# Patient Record
Sex: Male | Born: 1935 | Race: White | Hispanic: No | Marital: Married
Health system: Southern US, Community
[De-identification: ages and names within clinical notes are randomized; demographics above are authoritative.]

---

## 2016-10-21 ENCOUNTER — Other Ambulatory Visit: Payer: Self-pay | Admitting: Orthopaedic Surgery

## 2016-10-21 DIAGNOSIS — M48062 Spinal stenosis, lumbar region with neurogenic claudication: Secondary | ICD-10-CM

## 2016-10-21 DIAGNOSIS — M542 Cervicalgia: Secondary | ICD-10-CM

## 2016-11-02 ENCOUNTER — Ambulatory Visit
Admission: RE | Admit: 2016-11-02 | Discharge: 2016-11-02 | Disposition: A | Discharge status: Home / Self Care | Payer: Medicare Other | Source: Ambulatory Visit | Attending: Orthopaedic Surgery | Admitting: Orthopaedic Surgery

## 2016-11-02 DIAGNOSIS — M48062 Spinal stenosis, lumbar region with neurogenic claudication: Secondary | ICD-10-CM

## 2016-11-02 DIAGNOSIS — M542 Cervicalgia: Secondary | ICD-10-CM

## 2016-11-02 MED ORDER — DIAZEPAM 5 MG PO TABS
5.0000 mg | ORAL_TABLET | Freq: Once | ORAL | Status: AC
  Administered 2016-11-02: 5 mg via ORAL

## 2016-11-02 MED ORDER — IOPAMIDOL (ISOVUE-M 300) INJECTION 61%
10.0000 mL | Freq: Once | INTRAMUSCULAR | Status: AC | PRN
  Administered 2016-11-02: 10 mL via INTRATHECAL

## 2016-11-02 MED ORDER — ONDANSETRON HCL 4 MG/2ML IJ SOLN: 4.0000 mg | Freq: Four times a day (QID) | INTRAMUSCULAR | Status: DC | PRN

## 2016-11-02 NOTE — Progress Notes (Signed)
Patient's wife says he has been off Brillinta for at least the past five days and Zoloft for at least two days.

## 2016-11-02 NOTE — Discharge Instructions (Addendum)
Myelogram Discharge Instructions  1. Go home and rest quietly for the next 24 hours.  It is important to lie flat for the next 24 hours.  Get up only to go to the restroom.  You may lie in the bed or on a couch on your back, your stomach, your left side or your right side.  You may have one pillow under your head.  You may have pillows between your knees while you are on your side or under your knees while you are on your back.  2. DO NOT drive today.  Recline the seat as far back as it will go, while still wearing your seat belt, on the way home.  3. You may get up to go to the bathroom as needed.  You may sit up for 10 minutes to eat.  You may resume your normal diet and medications unless otherwise indicated.  Drink lots of extra fluids today and tomorrow.  4. The incidence of headache, nausea, or vomiting is about 5% (one in 20 patients).  If you develop a headache, lie flat and drink plenty of fluids until the headache goes away.  Caffeinated beverages may be helpful.  If you develop severe nausea and vomiting or a headache that does not go away with flat bed rest, call [phone].  5. You may resume normal activities after your 24 hours of bed rest is over; however, do not exert yourself strongly or do any heavy lifting tomorrow. If when you get up you have a headache when standing, go back to bed and force fluids for another 24 hours.  6. Call your physician for a follow-up appointment.  The results of your myelogram will be sent directly to your physician by the following day.  7. If you have any questions or if complications develop after you arrive home, please call [phone].  Discharge instructions have been explained to the patient.  The patient, or the person responsible for the patient, fully understands these instructions.          May resume Zoloft on Oct. 30, 2018, after 10:30 am.

## 2018-05-08 IMAGING — XA DG MYELOGRAPHY LUMBAR INJ MULTI REGION
12 of 19 series · 12 of 19 positions shown · non-contrast
Comparison: none

CLINICAL DATA: Bilateral leg pain in weakness. Bilateral shoulder
and arm pain and arm weakness.
TECHNIQUE: Contiguous axial images were obtained through the Cervical and
Lumbar spine after the intrathecal infusion of infusion. Coronal and
sagittal reconstructions were obtained of the axial image sets.

[Series 1: vasc adipose · 1 of 1 slices shown (1 of 11)]
[im 1/1]
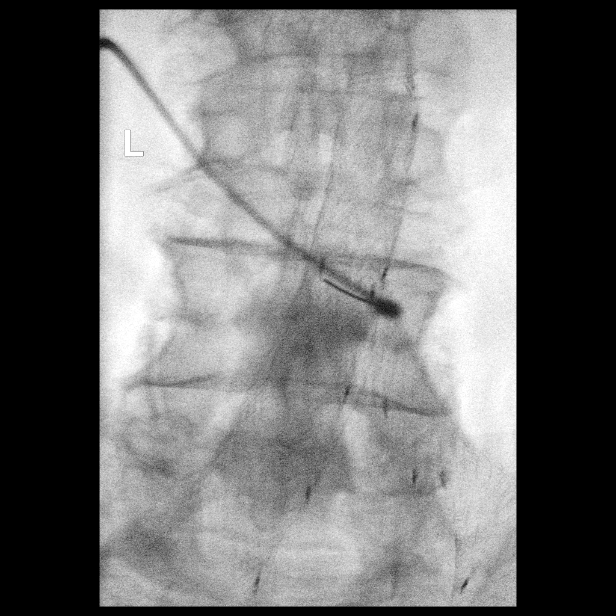

[Series 2: vasc adipose · 1 of 1 slices shown (2 of 11)]
[im 1/1]
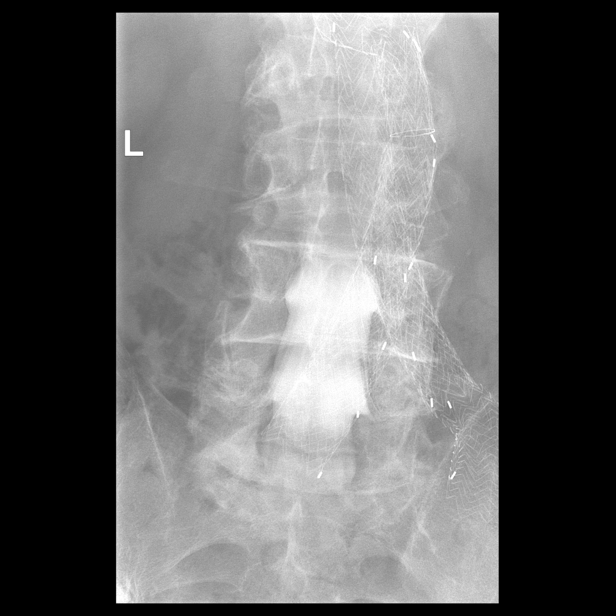

[Series 2: w lumbar spine flexion · 0.15mm/px · 1 of 1 slices shown]
[im 1/1]
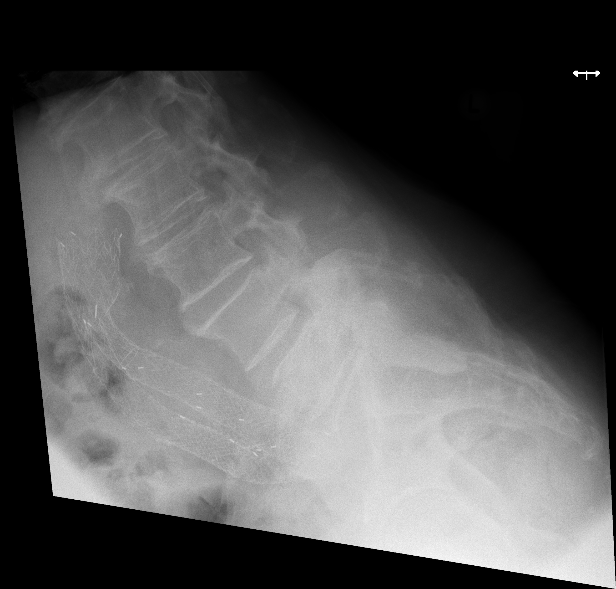

[Series 3: vasc adipose · 1 of 1 slices shown (3 of 11)]
[im 1/1]
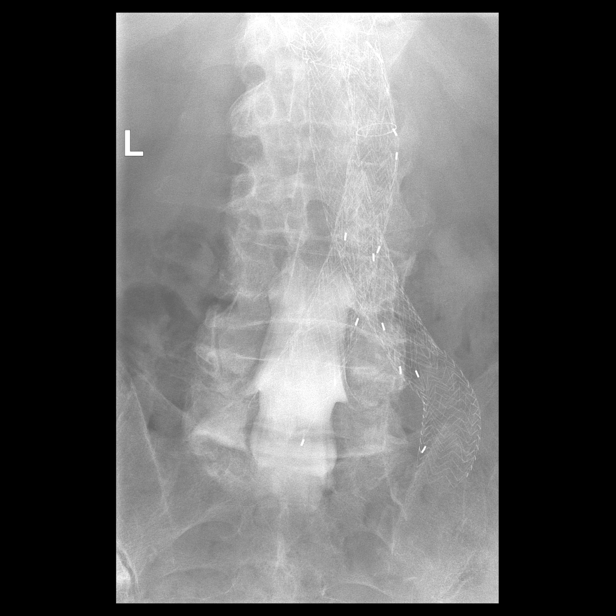

[Series 5: vasc adipose · 1 of 1 slices shown (4 of 11)]
[im 1/1]
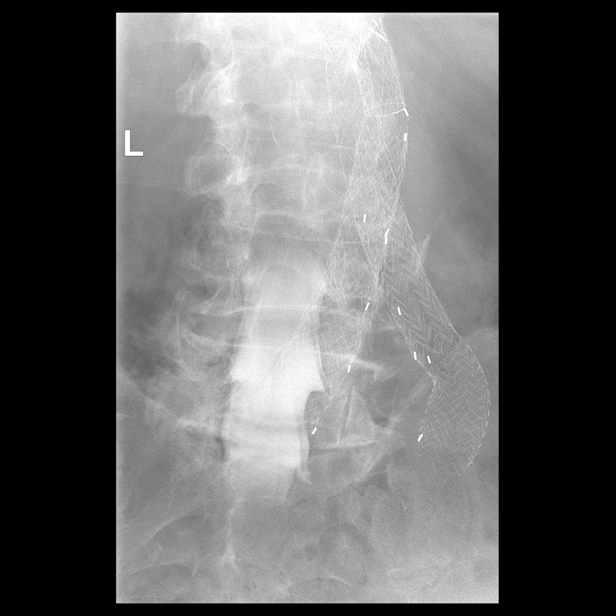

[Series 6: vasc adipose · 1 of 1 slices shown (5 of 11)]
[im 1/1]
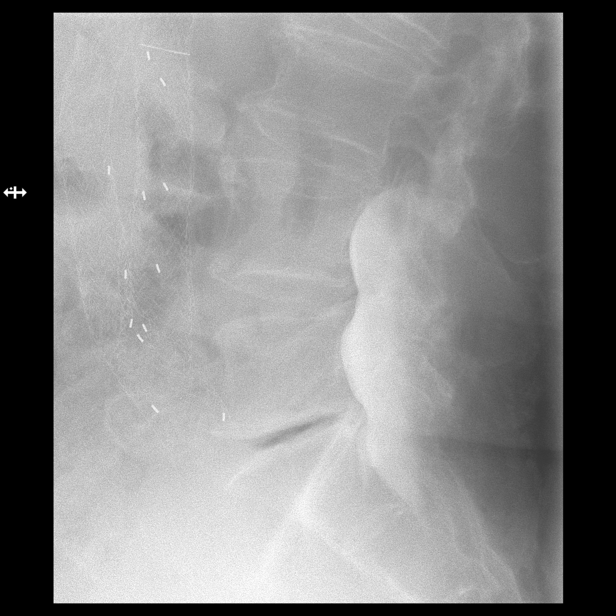

[Series 8: vasc adipose · 1 of 1 slices shown (6 of 11)]
[im 1/1]
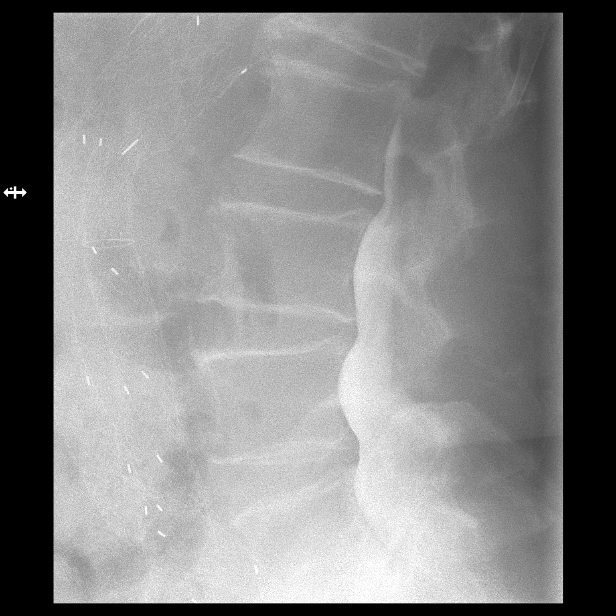

[Series 9: vasc adipose · 1 of 1 slices shown (7 of 11)]
[im 1/1]
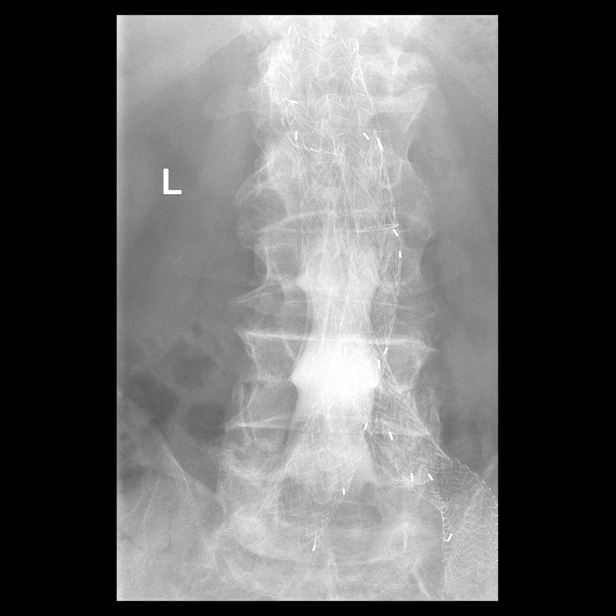

[Series 11: vasc adipose · 1 of 1 slices shown (8 of 11)]
[im 1/1]
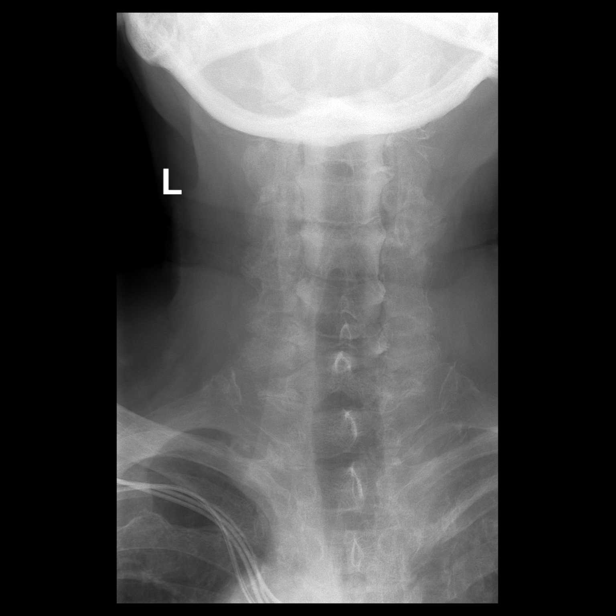

[Series 13: vasc adipose · 1 of 1 slices shown (9 of 11)]
[im 1/1]
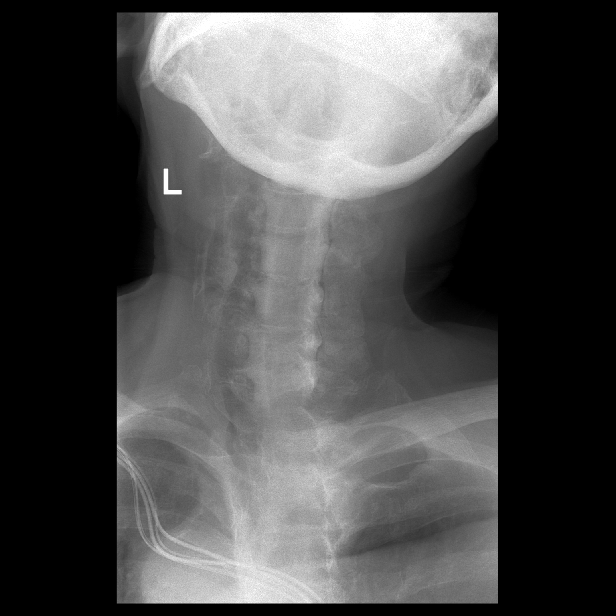

[Series 14: vasc adipose · 1 of 1 slices shown (10 of 11)]
[im 1/1]
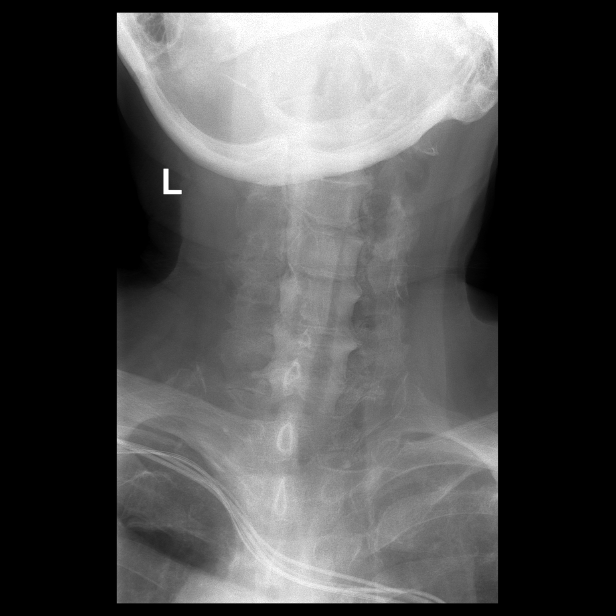

[Series 17: vasc adipose · 1 of 1 slices shown (11 of 11)]
[im 1/1]
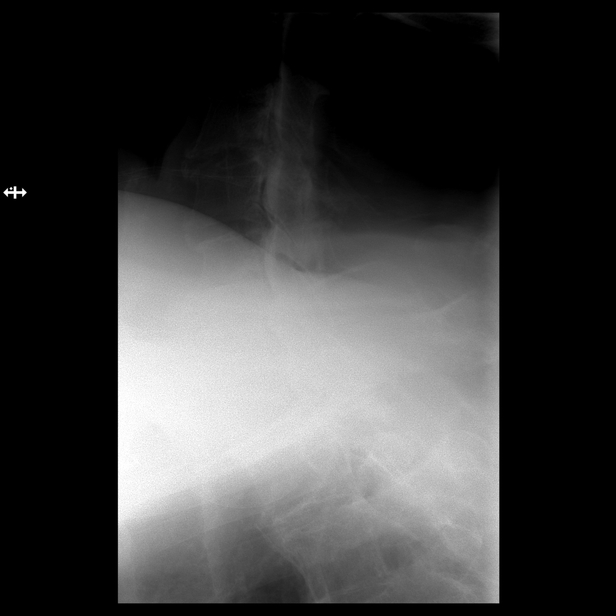

[12 of 19 positions shown; findings below may reference images not displayed]

FLUOROSCOPY TIME:  1 minutes 3 seconds. 446.22 micro gray meter
squared

PROCEDURE:
LUMBAR PUNCTURE FOR CERVICAL AND LUMBAR MYELOGRAM

CERVICAL AND LUMBAR MYELOGRAM

CT CERVICAL MYELOGRAM

CT LUMBAR MYELOGRAM

After thorough discussion of risks and benefits of the procedure
including bleeding, infection, injury to nerves, blood vessels,
adjacent structures as well as headache and CSF leak, written and
oral informed consent was obtained. Consent was obtained by Dr. Alakhe
Namouchi.

Patient was positioned prone on the fluoroscopy table. Local
anesthesia was provided with 1% lidocaine without epinephrine after
prepped and draped in the usual sterile fashion. Puncture was
performed at L3-4 using a 3 1/2 inch 22-gauge spinal needle via
right para median approach. Using a single pass through the dura,
the needle was placed within the thecal sac, with return of clear
CSF. 10 mL of Isovue 7-YGG was injected into the thecal sac, with
normal opacification of the nerve roots and cauda equina consistent
with free flow within the subarachnoid space. The patient was then
moved to the trendelenburg position and contrast flowed into the
Cervical spine region.

I personally performed the lumbar puncture and administered the
intrathecal contrast. I also personally performed acquisition of the
myelogram images.
FINDINGS: CERVICAL AND LUMBAR MYELOGRAM FINDINGS:

Lumbar region: Small anterior extradural defects consistent with
ordinary minimal disc bulges. No central canal stenosis. No evidence
of focal nerve root compression. No abnormal motion with flexion
extension.

Cervical region: Small anterior extradural defects. No convincing
focal nerve compression. Slightly diminished filling of the C4 and
C5 root sleeves, possibly subclinical.

Standing flexion extension views do not show subluxation in the
lumbar region.

CT CERVICAL MYELOGRAM FINDINGS:

Foramen magnum is widely patent. Ordinary osteoarthritis at the C1-2
articulation. No stenosis.

C2-3: Chronic facet fusion on the left. No canal or foraminal
stenosis.

C3-4: Spondylosis with endplate osteophytes and bulging of the disc.
Facet osteoarthritis right worse than left. No compressive central
canal stenosis. Mild foraminal narrowing bilaterally.

C4-5: Pronounced left-sided facet arthritis. Mild bulging of the
disc. No compressive central canal stenosis. Mild bilateral
foraminal narrowing.

C5-6: Spondylosis with endplate osteophytes. Bilateral facet
osteoarthritis right worse than left. Mild bilateral foraminal
narrowing.

C6-7: Spondylosis with endplate osteophytes and bulging of the disc.
No compressive central canal stenosis. Mild facet osteoarthritis. No
foraminal stenosis.

C7-T1: Bilateral facet osteoarthritis right worse than left. No disc
pathology. No canal or foraminal stenosis.

T1-2 and T2-3 are unremarkable.

CT LUMBAR MYELOGRAM FINDINGS:

T12-L1:  Normal.

L1-2:  Normal.

L2-3:  Normal.

L3-4: Mild bulging of the disc. Mild facet osteoarthritis. No canal
or foraminal stenosis.

L4-5: Bulging of the disc. Facet degeneration right worse than left.
No canal or foraminal stenosis.

L5-S1: Disc degeneration with vacuum phenomenon. Mild bulging of the
disc. No central canal stenosis. Mild bilateral foraminal stenosis.
IMPRESSION: Cervical region: Chronic degenerative disc disease and degenerative
facet disease. No compressive central canal stenosis. Mild bilateral
foraminal narrowing at C3-4, C4-5, and C5-6, but without likely
neural compression. Findings could certainly relate to neck pain.

Lumbar region: Chronic degenerative disc disease and degenerative
facet disease. No compressive canal stenosis. Bilateral foraminal
narrowing at L5-S1 but without distinct neural compression.

## 2018-05-08 IMAGING — CT CT CERVICAL SPINE W/ CM
3 series · 8 of 14 positions shown, 9 images · non-contrast
Comparison: none

CLINICAL DATA: Bilateral leg pain in weakness. Bilateral shoulder
and arm pain and arm weakness.
TECHNIQUE: Contiguous axial images were obtained through the Cervical and
Lumbar spine after the intrathecal infusion of infusion. Coronal and
sagittal reconstructions were obtained of the axial image sets.

[Series 2: cspine soft · axial · 0.38mm/px · z∈[-624,-552]mm · 2 of 110 slices shown]
[im 37/110  soft-tissue]
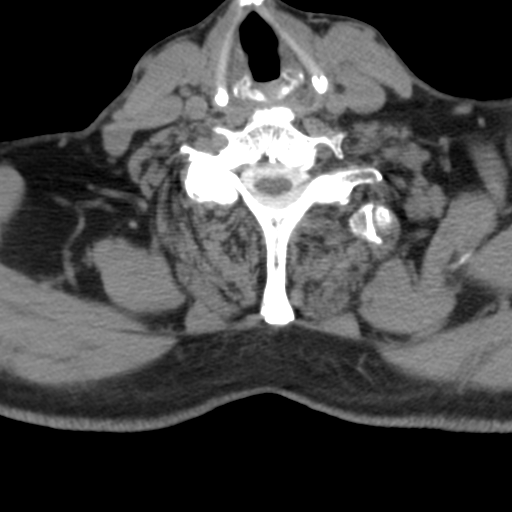
[im 73/110  soft-tissue]
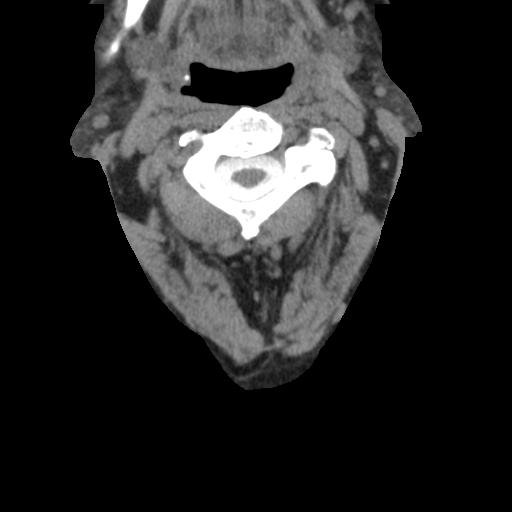

[Series 3: c spine bone · axial · 0.38mm/px · z∈[-640,-532]mm · 3 of 108 slices shown]
[im 27/108  bone]
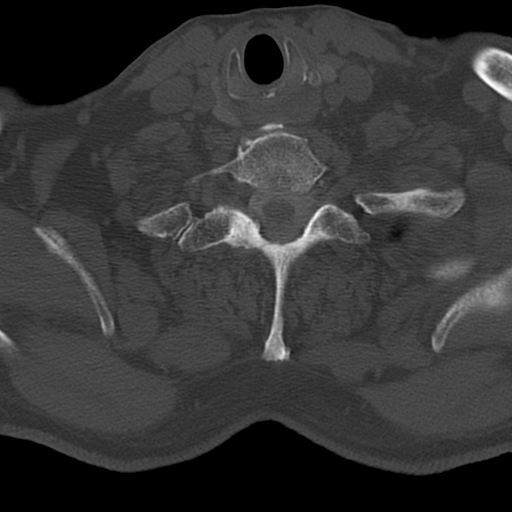
[im 54/108  bone]
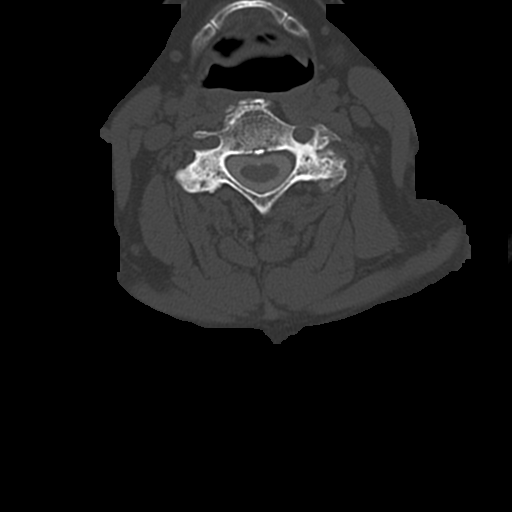
[im 81/108  bone]
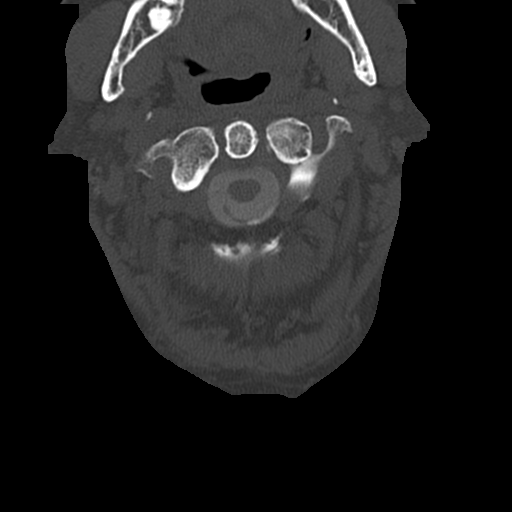

[Series 8: angled axial · axial · 0.29mm/px · z∈[-654,-548]mm · 3 of 108 slices shown, 4 images]
[im 27/108  soft-tissue]
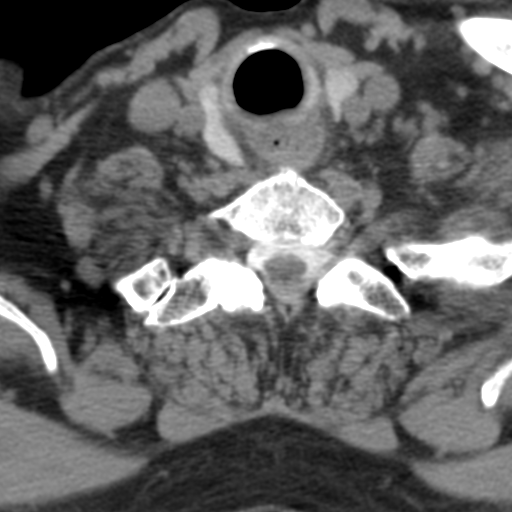
[im 27/108  bone]
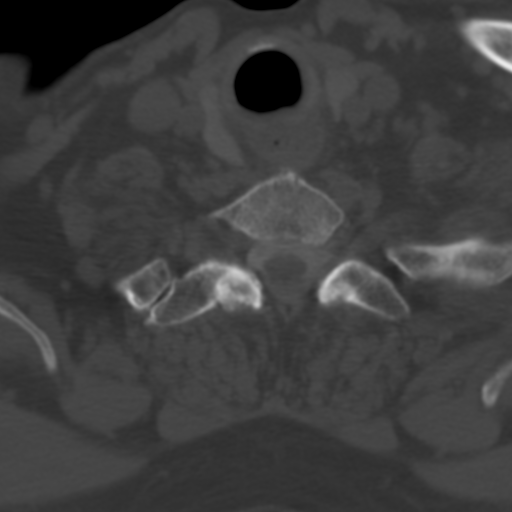
[im 54/108  bone]
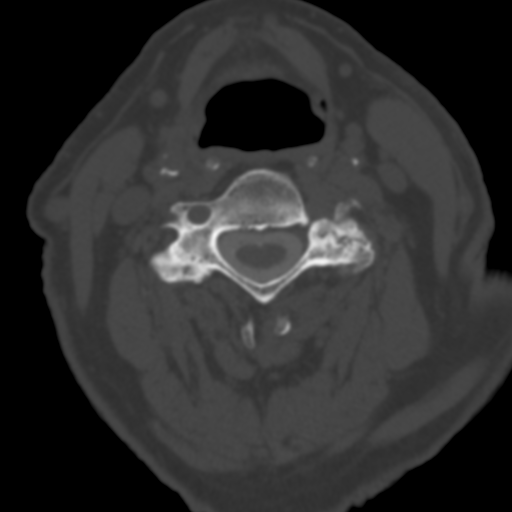
[im 81/108  bone]
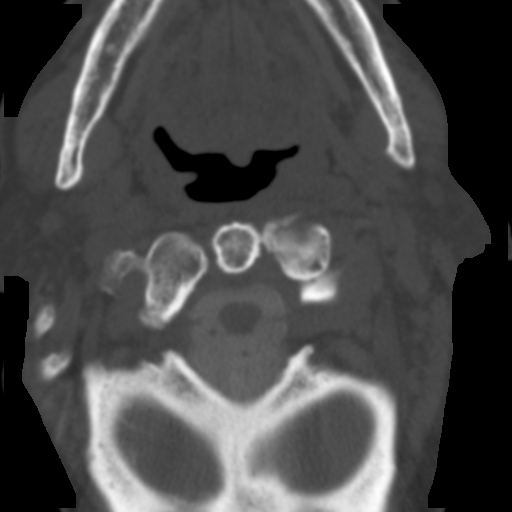

[8 of 14 positions shown; findings below may reference images not displayed]

FLUOROSCOPY TIME:  1 minutes 3 seconds. 446.22 micro gray meter
squared

PROCEDURE:
LUMBAR PUNCTURE FOR CERVICAL AND LUMBAR MYELOGRAM

CERVICAL AND LUMBAR MYELOGRAM

CT CERVICAL MYELOGRAM

CT LUMBAR MYELOGRAM

After thorough discussion of risks and benefits of the procedure
including bleeding, infection, injury to nerves, blood vessels,
adjacent structures as well as headache and CSF leak, written and
oral informed consent was obtained. Consent was obtained by Dr. Alakhe
Namouchi.

Patient was positioned prone on the fluoroscopy table. Local
anesthesia was provided with 1% lidocaine without epinephrine after
prepped and draped in the usual sterile fashion. Puncture was
performed at L3-4 using a 3 1/2 inch 22-gauge spinal needle via
right para median approach. Using a single pass through the dura,
the needle was placed within the thecal sac, with return of clear
CSF. 10 mL of Isovue 7-YGG was injected into the thecal sac, with
normal opacification of the nerve roots and cauda equina consistent
with free flow within the subarachnoid space. The patient was then
moved to the trendelenburg position and contrast flowed into the
Cervical spine region.

I personally performed the lumbar puncture and administered the
intrathecal contrast. I also personally performed acquisition of the
myelogram images.
FINDINGS: CERVICAL AND LUMBAR MYELOGRAM FINDINGS:

Lumbar region: Small anterior extradural defects consistent with
ordinary minimal disc bulges. No central canal stenosis. No evidence
of focal nerve root compression. No abnormal motion with flexion
extension.

Cervical region: Small anterior extradural defects. No convincing
focal nerve compression. Slightly diminished filling of the C4 and
C5 root sleeves, possibly subclinical.

Standing flexion extension views do not show subluxation in the
lumbar region.

CT CERVICAL MYELOGRAM FINDINGS:

Foramen magnum is widely patent. Ordinary osteoarthritis at the C1-2
articulation. No stenosis.

C2-3: Chronic facet fusion on the left. No canal or foraminal
stenosis.

C3-4: Spondylosis with endplate osteophytes and bulging of the disc.
Facet osteoarthritis right worse than left. No compressive central
canal stenosis. Mild foraminal narrowing bilaterally.

C4-5: Pronounced left-sided facet arthritis. Mild bulging of the
disc. No compressive central canal stenosis. Mild bilateral
foraminal narrowing.

C5-6: Spondylosis with endplate osteophytes. Bilateral facet
osteoarthritis right worse than left. Mild bilateral foraminal
narrowing.

C6-7: Spondylosis with endplate osteophytes and bulging of the disc.
No compressive central canal stenosis. Mild facet osteoarthritis. No
foraminal stenosis.

C7-T1: Bilateral facet osteoarthritis right worse than left. No disc
pathology. No canal or foraminal stenosis.

T1-2 and T2-3 are unremarkable.

CT LUMBAR MYELOGRAM FINDINGS:

T12-L1:  Normal.

L1-2:  Normal.

L2-3:  Normal.

L3-4: Mild bulging of the disc. Mild facet osteoarthritis. No canal
or foraminal stenosis.

L4-5: Bulging of the disc. Facet degeneration right worse than left.
No canal or foraminal stenosis.

L5-S1: Disc degeneration with vacuum phenomenon. Mild bulging of the
disc. No central canal stenosis. Mild bilateral foraminal stenosis.
IMPRESSION: Cervical region: Chronic degenerative disc disease and degenerative
facet disease. No compressive central canal stenosis. Mild bilateral
foraminal narrowing at C3-4, C4-5, and C5-6, but without likely
neural compression. Findings could certainly relate to neck pain.

Lumbar region: Chronic degenerative disc disease and degenerative
facet disease. No compressive canal stenosis. Bilateral foraminal
narrowing at L5-S1 but without distinct neural compression.

## 2022-09-06 DEATH — deceased
